# Patient Record
Sex: Male | Born: 1955 | Race: White | Hispanic: No | Marital: Single | Smoking: Current every day smoker
Health system: Southern US, Community
[De-identification: ages and names within clinical notes are randomized; demographics above are authoritative.]

## PROBLEM LIST (undated history)

## (undated) DIAGNOSIS — E669 Obesity, unspecified: Secondary | ICD-10-CM

## (undated) DIAGNOSIS — G4733 Obstructive sleep apnea (adult) (pediatric): Secondary | ICD-10-CM

## (undated) DIAGNOSIS — Z9989 Dependence on other enabling machines and devices: Secondary | ICD-10-CM

## (undated) DIAGNOSIS — Z720 Tobacco use: Secondary | ICD-10-CM

## (undated) DIAGNOSIS — E785 Hyperlipidemia, unspecified: Secondary | ICD-10-CM

## (undated) HISTORY — DX: Tobacco use: Z72.0

## (undated) HISTORY — DX: Dependence on other enabling machines and devices: Z99.89

## (undated) HISTORY — DX: Obesity, unspecified: E66.9

## (undated) HISTORY — DX: Hyperlipidemia, unspecified: E78.5

## (undated) HISTORY — DX: Obstructive sleep apnea (adult) (pediatric): G47.33

## (undated) HISTORY — PX: CHOLECYSTECTOMY: SHX55

---

## 2001-01-14 ENCOUNTER — Encounter: Payer: Self-pay | Admitting: Emergency Medicine

## 2001-01-14 ENCOUNTER — Inpatient Hospital Stay (HOSPITAL_COMMUNITY): Admission: EM | Admit: 2001-01-14 | Discharge: 2001-01-16 | Payer: Self-pay | Admitting: Emergency Medicine

## 2001-01-15 ENCOUNTER — Encounter: Payer: Self-pay | Admitting: *Deleted

## 2004-09-22 ENCOUNTER — Emergency Department (HOSPITAL_COMMUNITY): Admission: EM | Admit: 2004-09-22 | Discharge: 2004-09-22 | Payer: Self-pay | Admitting: Emergency Medicine

## 2010-05-25 ENCOUNTER — Observation Stay (HOSPITAL_COMMUNITY): Admission: EM | Admit: 2010-05-25 | Discharge: 2010-05-26 | Payer: Self-pay | Source: Home / Self Care

## 2010-05-25 LAB — CBC
HCT: 41.9 % (ref 39.0–52.0)
Hemoglobin: 15.3 g/dL (ref 13.0–17.0)
MCH: 31 pg (ref 26.0–34.0)
MCHC: 36.5 g/dL — ABNORMAL HIGH (ref 30.0–36.0)
MCV: 84.8 fL (ref 78.0–100.0)
Platelets: 184 10*3/uL (ref 150–400)
RBC: 4.94 MIL/uL (ref 4.22–5.81)
RDW: 13.5 % (ref 11.5–15.5)
WBC: 6.9 10*3/uL (ref 4.0–10.5)

## 2010-05-25 LAB — DIFFERENTIAL
Basophils Absolute: 0.1 10*3/uL (ref 0.0–0.1)
Basophils Relative: 1 % (ref 0–1)
Eosinophils Absolute: 0.2 10*3/uL (ref 0.0–0.7)
Eosinophils Relative: 3 % (ref 0–5)
Lymphocytes Relative: 26 % (ref 12–46)
Lymphs Abs: 1.8 10*3/uL (ref 0.7–4.0)
Monocytes Absolute: 1 10*3/uL (ref 0.1–1.0)
Monocytes Relative: 14 % — ABNORMAL HIGH (ref 3–12)
Neutro Abs: 3.9 10*3/uL (ref 1.7–7.7)
Neutrophils Relative %: 57 % (ref 43–77)

## 2010-05-25 LAB — POCT CARDIAC MARKERS
CKMB, poc: 1 ng/mL (ref 1.0–8.0)
CKMB, poc: 1.4 ng/mL (ref 1.0–8.0)
Myoglobin, poc: 100 ng/mL (ref 12–200)
Myoglobin, poc: 98.1 ng/mL (ref 12–200)
Troponin i, poc: <0.05 ng/mL (ref 0.00–0.09)
Troponin i, poc: <0.05 ng/mL (ref 0.00–0.09)

## 2010-05-25 LAB — BASIC METABOLIC PANEL
BUN: 11 mg/dL (ref 6–23)
CO2: 23 mEq/L (ref 19–32)
Calcium: 8.9 mg/dL (ref 8.4–10.5)
Chloride: 108 mEq/L (ref 96–112)
Creatinine, Ser: 1.13 mg/dL (ref 0.4–1.5)
GFR calc Af Amer: >60 mL/min (ref >60)
GFR calc non Af Amer: >60 mL/min (ref >60)
Glucose, Bld: 108 mg/dL — ABNORMAL HIGH (ref 70–99)
Potassium: 3.8 mEq/L (ref 3.5–5.1)
Sodium: 138 mEq/L (ref 135–145)

## 2010-05-25 LAB — CARDIAC PANEL(CRET KIN+CKTOT+MB+TROPI)
CK, MB: 2.8 ng/mL (ref 0.3–4.0)
Relative Index: 2.4 (ref 0.0–2.5)
Total CK: 115 U/L (ref 7–232)
Troponin I: 0.01 ng/mL (ref 0.00–0.06)

## 2010-05-26 ENCOUNTER — Encounter (INDEPENDENT_AMBULATORY_CARE_PROVIDER_SITE_OTHER): Payer: Self-pay | Admitting: *Deleted

## 2010-05-26 LAB — CONVERTED CEMR LAB
BUN: 11 mg/dL
CO2: 23 meq/L
Calcium: 8.9 mg/dL
Chloride: 108 meq/L
Cholesterol: 85 mg/dL
Creatinine, Ser: 1.13 mg/dL
Glucose, Bld: 108 mg/dL
HCT: 41.9 %
HDL: 15 mg/dL
Hemoglobin: 15.3 g/dL
Hgb A1c MFr Bld: 6 %
Hgb A1c MFr Bld: 6 %
LDL Cholesterol: 48 mg/dL
MCV: 85 fL
Platelets: 184 10*3/uL
Potassium: 3.8 meq/L
Sodium: 138 meq/L
Triglyceride fasting, serum: 112 mg/dL
WBC: 6.9 10*3/uL

## 2010-05-26 LAB — CARDIAC PANEL(CRET KIN+CKTOT+MB+TROPI)
CK, MB: 1.7 ng/mL (ref 0.3–4.0)
CK, MB: 2.1 ng/mL (ref 0.3–4.0)
Relative Index: INVALID (ref 0.0–2.5)
Relative Index: INVALID (ref 0.0–2.5)
Total CK: 85 U/L (ref 7–232)
Total CK: 84 U/L (ref 7–232)
Troponin I: <0.01 ng/mL (ref 0.00–0.06)
Troponin I: <0.01 ng/mL (ref 0.00–0.06)

## 2010-05-26 LAB — LIPID PANEL
Cholesterol: 85 mg/dL (ref 0–200)
HDL: 15 mg/dL — ABNORMAL LOW (ref >39)
LDL Cholesterol: 48 mg/dL (ref 0–99)
Total CHOL/HDL Ratio: 5.7 RATIO
Triglycerides: 112 mg/dL (ref <150)
VLDL: 22 mg/dL (ref 0–40)

## 2010-05-26 LAB — HEMOGLOBIN A1C
Hgb A1c MFr Bld: 6 % — ABNORMAL HIGH (ref <5.7)
Mean Plasma Glucose: 126 mg/dL — ABNORMAL HIGH (ref <117)

## 2010-05-31 ENCOUNTER — Ambulatory Visit
Admission: RE | Admit: 2010-05-31 | Discharge: 2010-05-31 | Payer: Self-pay | Source: Home / Self Care | Attending: Cardiology | Admitting: Cardiology

## 2010-05-31 DIAGNOSIS — E669 Obesity, unspecified: Secondary | ICD-10-CM | POA: Insufficient documentation

## 2010-05-31 DIAGNOSIS — F17200 Nicotine dependence, unspecified, uncomplicated: Secondary | ICD-10-CM | POA: Insufficient documentation

## 2010-05-31 DIAGNOSIS — R079 Chest pain, unspecified: Secondary | ICD-10-CM | POA: Insufficient documentation

## 2010-06-01 ENCOUNTER — Encounter: Payer: Self-pay | Admitting: Cardiology

## 2010-06-14 ENCOUNTER — Ambulatory Visit (HOSPITAL_COMMUNITY)
Admission: RE | Admit: 2010-06-14 | Discharge: 2010-06-14 | Payer: Self-pay | Source: Home / Self Care | Attending: Cardiology | Admitting: Cardiology

## 2010-06-14 ENCOUNTER — Ambulatory Visit
Admission: RE | Admit: 2010-06-14 | Discharge: 2010-06-14 | Payer: Self-pay | Source: Home / Self Care | Attending: Cardiology | Admitting: Cardiology

## 2010-06-17 NOTE — Discharge Summary (Addendum)
Bobby Ramos              ACCOUNT NO.:  [id number]  MEDICAL RECORD NO.:  [id number]          PATIENT TYPE:  OBV  LOCATION:  A203                          FACILITY:  [facility]  PHYSICIAN:  Ramiro Harvest, MD    DATE OF BIRTH:  1955/09/05  DATE OF ADMISSION:  05/25/2010 DATE OF DISCHARGE:  01/05/2012LH                         DISCHARGE SUMMARY-REFERRING   The patient's primary care physician is with [facility].  DISCHARGE DIAGNOSES: 1. Chest pain, resolved. 2. Obstructive sleep apnea. 3. Tobacco abuse. 4. Obesity. 5. Status post cholecystectomy.  DISCHARGE MEDICATIONS: 1. Aspirin 325 mg p.o. daily. 2. Nitroglycerin 0.4 mg sublingual q.5 minutes x3 p.r.n. chest pain. 3. Ibuprofen 800 mg p.o. q.4 h p.r.n. 4. Multivitamin 1 tablet p.o. daily.  DISPOSITION AND FOLLOWUP:  The patient will be discharged home.  The patient is to follow up with PCP in the next 1 to 2 weeks.  On followup, the patient's fasting lipid panel will need to be reassessed as well as his chest pain.  The patient is also scheduled to follow up with [facility] in [place] on May 31, 2010, at 1:15 p.m. for reassessment and for probable outpatient stress test.  CONSULTATIONS DONE:  None.  PROCEDURES PERFORMED:  A chest x-ray was done May 25, 2010, that showed borderline cardiomegaly, no CHF or active disease in 1 view.  BRIEF ADMISSION HISTORY AND PHYSICAL:  Bobby Ramos is a pleasant 55 year old gentleman with a history of tobacco abuse.  The patient has no known diabetes.  No history of hypertension.  Unknown lipid status. No significant family history of early coronary artery disease.  The patient did state that he was at his job on the morning of admission standing without particularly exerting himself, at which time he developed acute onset of possibly questionable type substernal chest sensation.  The patient states that it was not exactly pain.  It was  not stabbing or sharp.  It was simply a feeling that he not had before, and it felt dull in nature.  This was accompanied by nausea as well as diaphoresis.  It was quite troubling to the patient, and again he was standing at the time it came on but was not exerting himself.  He went home and changed clothes.  His symptoms persisted.  As a result, he presented to the [facility] for evaluation.  EKG there which was done was nonacute.  The patient was given aspirin, nitroglycerin, and this was approximately 1 hour total from the onset of his symptoms.  His symptoms then abruptly resolved.  He was then referred to [facility] emergency department for evaluation.  At the time of evaluation per admitting physician, the patient was resting comfortably in the emergency room at [facility].  He had no complaints whatsoever at the present time.  He was having no chest discomfort.  No shortness of breath.  No nausea, no vomiting, no diaphoresis.  The patient denied any prior similar episodes.  For the rest of the admission history and physical, please see the history and physical dictated per Dr. Sharon Seller of job number [id number].  HOSPITAL COURSE:  Chest pain.  The patient was admitted for a chest pain rule out.  He was placed on tele, and he was admitted secondary to a sensation of some pressure and his pain as well as nausea and diaphoresis.  The patient does have a history of tobacco abuse and did have a cardiac catheterization in August 2002 that showed mild hemodynamically insignificant coronary artery disease at that time.  EKG which was done showed a normal sinus rhythm.  Cardiac enzymes were cycled q.8 h x3, which were negative x3.  The patient was placed on a full-dose aspirin and placed on nitroglycerin on an as-needed basis. The patient was initially started on low-dose beta blocker, which he was able to tolerate, however, had a little bit of a sinus bradycardia  with heart rate in the 55s and a systolic blood pressure of 109.  His low- dose beta blocker was discontinued.  The patient did not have any further chest pain during the hospitalization.  A fasting lipid panel was pending at the time of discharge.  The patient will be discharged home in stable and improved condition to follow up with [facility] for probable outpatient stress test.  The patient will be discharged in stable and improved condition, and the patient was counseled on tobacco cessation, and a tobacco cessation consultation was requested during the hospitalization.  The patient will be discharged home in stable and improved condition.  The rest of the patient's chronic medical issues remained stable throughout the hospitalization. The patient was discharged in stable condition.  On the day of discharge, vital signs were temperature 97.6, pulse of 64, respirations 21, blood pressure 109/69, satting 94% on room air.  It was a pleasure taking care of [name].     Ramiro Harvest, MD     DT/MEDQ  D:  05/26/2010  T:  05/26/2010  Job:  [id number]  cc:   [facility]  Electronically Signed by Ramiro Harvest MD on 06/17/2010 12:14:15 PM

## 2010-06-20 ENCOUNTER — Encounter (INDEPENDENT_AMBULATORY_CARE_PROVIDER_SITE_OTHER): Payer: Self-pay | Admitting: *Deleted

## 2010-06-20 ENCOUNTER — Ambulatory Visit
Admission: RE | Admit: 2010-06-20 | Discharge: 2010-06-20 | Payer: Self-pay | Source: Home / Self Care | Attending: Cardiology | Admitting: Cardiology

## 2010-06-23 NOTE — Assessment & Plan Note (Signed)
Summary: new post hosp [name] per [name]/tg   Visit Type:  Follow-up Primary Provider:  [facility]   History of Present Illness: [name] is seen in the office today following a recent admission to [facility] for chest discomfort.  He presented to [facility] with moderately severe nondescript lower substernal chest discomfort without radiation or associated symptoms.  The onset of discomfort occurred when he was standing but not exerting himself.  Symptoms were relieved with sublingual nitroglycerin at his primary care physician's office.  Myocardial infarction was ruled out, and he was referred to [facility] cardiology for further evaluation.    Mr. [name] was hospitalized for chest discomfort in 2002 at which time cardiac catheterization revealed no valvular problems, normal left ventricular systolic function and insignificant coronary disease with the worst lesion being a 30% LAD stenosis.  Cardiovascular risk factors are modest with no history of hypertension, diabetes or premature coronary disease in the family.  He has been a cigarette smoker and has low HDL as well as low total and LDL cholesterol.  Current Medications (verified): 1)  Aspirin 325 Mg Tabs (Aspirin) .... Take 1 Tab Daily 2)  Nitrostat 0.4 Mg Subl (Nitroglycerin) .Marland Kitchen.. 1 Tablet Under Tongue At Onset of Chest Pain; You May Repeat Every 5 Minutes For Up To 3 Doses. 3)  Ibuprofen 800 Mg Tabs (Ibuprofen) .... Take Prn 4)  Daily-Vitamin  Tabs (Multiple Vitamin) .... Take 1 Tab Daily  Allergies (verified): No Known Drug Allergies  Comments:  Nurse/Medical Assistant: patient reviewed meds [facility] [place]  Past History:  Family History: Last updated: 06/06/2010 Father: Deceased as a result of Alzheimer's disease; h/o CAD Mother: s/p pacemaker implantation Siblings: 2 brothers and 2 sisters are alive and well  Social  History: Last updated: 06-06-10 Employment-[employer] of a [employer] Tobacco Use - 70 pack years; 1.5-2 packs per day Alcohol Use - modest Regular Exercise - no Drug Use - no Marital-single with no children  Past Medical History: Chest pain: 2002-insignificant CAD-30% LAD and RCA with normal EF; admit [facility] 05/2010 Dyslipidemia: Low HDL Obstructive sleep apnea treated with CPAP Tobacco abuse: 70 pack years; 1.5-2 packs per day Obesity  Past Surgical History: Cholecystectomy  EKG  Procedure date:  Jun 06, 2010  Findings:      Normal sinus rhythm Borderline left atrial abnormality Minor nonspecific T wave abnormality Nondiagnostic inferior Q waves No previous tracing for comparison.  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  Vital Signs:  Patient profile:   55 year old male Height:      70 inches Weight:      268 pounds BMI:     38.59 O2 Sat:      97 % on Room air Pulse rate:   77 / minute BP sitting:   128 / 73  (left arm)  Vitals Entered By: Dreama Saa, CNA (May 31, 2010 1:14 PM)  O2 Flow:  Room air  Physical Exam  General:  Obese; well-developed; no acute distress: HEENT-[place]/AT; PERRL; EOM  intact; conjunctiva and lids nl:  Neck-No JVD; no carotid bruits: Endocrine-No thyromegaly: Lungs-No tachypnea, clear without rales, rhonchi or wheezes: CV-normal PMI; normal S1 and S2: grade 2/6 basilar systolic ejection murmur Abdomen-BS normal; soft and non-tender without masses or organomegaly: MS-No deformities, cyanosis or clubbing: Neurologic-Nl cranial nerves; symmetric strength and tone: Skin- Warm, no sig. lesions: Extremities-Nl distal pulses; no edema    Impression & Recommendations:  Problem # 1:  CHEST PAIN (ICD-786.50) Symptoms were far from classic, but certainly could represent myocardial ischemia.  Since resting EKG is normal, we will proceed with a standard treadmill exercise test.  Problem # 2:  SYSTOLIC MURMUR (ZOX-096.0) Patient's murmur has the characteristics of aortic sclerosis or mild aortic stenosis.  An echocardiogram will be performed to differentiate between these possibilities.  Problem # 3:  TOBACCO ABUSE (ICD-305.1) Patient is strongly encouraged to consider discontinuation of tobacco use.  He attempted this with the assistance of Chantix in the past, but found that drug to be too expensive.  We will put him in touch with the [place] [facility] where free transdermal nicotine patches are currently available.  Other Orders: Treadmill (Treadmill) 2-D Echocardiogram (2D Echo)  Patient Instructions: 1)  Your physician recommends that you schedule a follow-up appointment in: AFTER TESTS 2)  Your physician has requested that you have an echocardiogram.  Echocardiography is a painless test that uses sound waves to create images of your heart. It provides your doctor with information about the size and shape of your heart and how well your heart's chambers and valves are working.  This procedure takes approximately one hour. There are no restrictions for this procedure. 3)  Your physician has requested that you have an exercise tolerance test.  For further information  please visit [email or web address]  Please also follow instruction sheet, as given.

## 2010-06-29 NOTE — Miscellaneous (Signed)
Summary: LABS A1C 05/26/2010  Clinical Lists Changes  Observations: Added new observation of HGBA1C: 6.0 % (05/26/2010 8:38)

## 2010-06-29 NOTE — Assessment & Plan Note (Signed)
Summary: ROV POST TREADMILL AND ECHO   Visit Type:  Follow-up Primary Provider:  [facility]   History of Present Illness: [name] returns to the office as scheduled for continued assessment and treatment of recent chest discomfort that prompted hospitalization.  Over the past few weeks, all symptoms have resolved.  He has had no further chest discomfort.  He also developed left knee pain that was present on the day of the stress test, but which has also resolved.  Echocardiography showed mild aortic stenosis, with normal left ventricular size and function and no aortic insufficiency.  A stress nuclear study was negative for ischemia.  Current Medications (verified): 1)  Aspirin 325 Mg Tabs (Aspirin) .... Take 1 Tab Daily 2)  Nitrostat 0.4 Mg Subl (Nitroglycerin) .Marland Kitchen.. 1 Tablet Under Tongue At Onset of Chest Pain; You May Repeat Every 5 Minutes For Up To 3 Doses. 3)  Daily-Vitamin  Tabs (Multiple Vitamin) .... Take 1 Tab Daily 4)  Fish Oil 1000 Mg Caps (Omega-3 Fatty Acids) .... Take 1 Cap Two Times A Day 5)  Pravastatin Sodium 40 Mg Tabs (Pravastatin Sodium) .... Take One Tablet By Mouth Daily At Bedtime  Allergies (verified): No Known Drug Allergies  Comments:  Nurse/Medical Assistant: patient didnt bring meds or list he is done with his ibuprofen pharmacy is [facility] in [place] we reviewed meds from previous ov  Past History:  PMH, FH, and Social History reviewed and updated.  Review of Systems  The patient denies weight loss, weight gain, chest pain, syncope, dyspnea on exertion, peripheral edema, prolonged cough, and abdominal pain.    Vital Signs:  Patient profile:   55 year old male Weight:      278 pounds O2 Sat:      96 % on Room air Pulse rate:   91 / minute BP sitting:   129 / 76  (left arm)  Vitals Entered By: Dreama Saa, CNA (June 20, 2010 2:49 PM)  O2 Flow:  Room air  Physical Exam  General:  Obese;  well-developed; no acute distress: Neck-No JVD; no carotid bruits: Lungs-No tachypnea, clear without rales, rhonchi or wheezes: CV-normal PMI; normal S1 and S2: grade 2/6 basilar systolic ejection murmur Abdomen-BS normal; soft and non-tender without masses or organomegaly: MS-No deformities, cyanosis or clubbing: Neurologic-Nl cranial nerves; symmetric strength and tone: Skin- Warm, no sig. lesions: Extremities-Nl distal pulses; no edema    Impression & Recommendations:  Problem # 1:  AORTIC STENOSIS-MILD (ICD-424.1) Valvular disease is quite mild at present and is certainly not causing any symptoms.  Annual followup for this problem is warranted.  Problem # 2:  CHEST PAIN (ICD-786.50) Chest discomfort has resolved and was likely not of cardiac origin.  No further testing or treatment is warranted unless symptoms return.  Problem # 3:  TOBACCO ABUSE (ICD-305.1) We continue to recommend smoking cessation and will assist [name] with this undertaking in any way that we can.  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  Treatment with a statin likely offers him some benefit in terms of a lower risk for a cardiovascular event and will be initiated.  Patient Instructions: 1)  Your physician recommends that you schedule a follow-up appointment in: 1 year 2)  Your physician has recommended you make the following change in your medication: pravastatin 40mg  daily Prescriptions: PRAVASTATIN SODIUM 40 MG TABS (PRAVASTATIN SODIUM) Take  one tablet by mouth daily at bedtime  #30 x 3   Entered by:   Teressa Lower RN   Authorized by:   Kathlen Brunswick, MD, [facility]   Signed by:   Teressa Lower RN on 06/20/2010   Method used:   Electronically to        [facility] (retail)       [address]       [place], [place]  [address]       Ph:  [phone]       Fax: [phone]   RxID:   [id number]

## 2010-10-07 NOTE — Discharge Summary (Signed)
[place]. [facility]  Patient:    VERONICA, GUERRANT Visit Number: [id number] MRN: [id number]          Service Type: MED Location: [facility] 2899 06 Attending Physician:  Glennon Hamilton Dictated by:   Tereso Newcomer, P.A. Admit Date:  01/15/2001 Discharge Date: 01/16/2001   CC:         Stacie Glaze, M.D. [facility]  [facility], [place]   Discharge Summary  DATE OF BIRTH: Feb 03, 1956  DISCHARGE DIAGNOSES: 1. Chest pain, etiology unclear. 2. Insignificant coronary artery disease. 3. Normal left ventricular ejection fraction of 68%. 4. Positive family history of coronary artery disease. 5. Ex-smoker.  PROCEDURES PERFORMED THIS ADMISSION: Cardiac catheterization by Dr. Daisey Must on January 16, 2001, revealing left main normal LAD, mid 30% stenosis circumflex with minor irregularities, RCA proximal 30% stenosis, distal 20/30% stenosis. LV-gram: Normal wall motion, EF 68%.  HOSPITAL COURSE: This 55 year old male was initially seen at [facility] in [place] for complaints of chest pain. He had no known cardiac history prior to admission at [facility]. He developed severe chest tightness radiating around to his left side associated with mild nausea while he was showering. He had no associated neck or arm pain, shortness of breath, diaphoresis, or palpitations. His activities include a lot of hiking and lifting. He denies symptoms with this. Initial enzymes at [facility] included CK-MB and troponin and these were negative x2. His ECG was also within normal limits. His cardiac risk factors included a positive family history for CAD. He is an ex-smoker who quit 2 years ago, but he denied any diabetes or hypertension.  His initial physical examination was unremarkable. His blood pressure was 109/70.  His ECG showed normal sinus rhythm. The options of how to work-up his chest pain were discussed with the patient. These  included gated exercise treadmill Cardiolite versus cardiac catheterization. Given the patients family history of CAD he opted for definitive answers and requested cardiac catheterization. Therefore he was transferred to [facility]. He underwent cardiac catheterization on January 16, 2001, by Dr. Loraine Leriche Dr. Gerri Spore.  The results are noted above. He tolerated the procedure well and had no immediate complications. His right femoral artery was closed using Perclose. Given his insignificant coronary artery disease by catheterization it was felt his chest pain was noncardiac. He would need continued followup with his primary care physician. It was recommended he continue to take an aspirin a day. He returned to the short-stay area to undergo postcatheterization rest and hydration. Once this period of time was complete, he was felt ready for discharge to home and was discharged to home in stable condition.  LABORATORY AND OTHER ANCILLARY DATA: Sodium 140, potassium 3.7, chloride 105, CO2 28, glucose 94, BUN 16, creatinine 1.1, calcium 9.8, total protein 7.2, albumin 3.8. AST 25, ALT 30, alkaline phosphatase 89, total bilirubin 0.7. INR 1.0. White blood cell count 7800. Hemoglobin 15.8, hematocrit 45.2, MCV 88.9. Platelet count 202,000. Cardiac enzymes negative x2.  Chest x-ray on January 15, 2001, revealing peribronchial cuffing attributed to bronchial inflammatory process, acute versus chronic. There were no focal infiltrates noted and the heart size was normal.  DISCHARGE MEDICATION: Aspirin 325 mg q.d.  ACTIVITY: No diving, heavy lifting, exertion of work for 3 days.  DIET: Low fat, low salt.  SPECIAL INSTRUCTIONS: He has been asked to call our office in [place] for any groin, swelling, bleeding or bruising.  FOLLOWUP: He should followup with Dr. Lovell Sheehan 1  week from discharge and he should call for an appointment and he should followup with Dr. Dietrich Pates as needed. Dictated  by:   Tereso Newcomer, P.A. Attending Physician:  Glennon Hamilton DD:  03/12/01 TD:  03/13/01 Job: [id number]

## 2010-10-07 NOTE — Discharge Summary (Signed)
[place]. [facility]  Patient:    Bobby Ramos, Bobby Ramos Visit Number: [id number] MRN: [id number]          Service Type: MED Location: [facility] 2899 06 Attending Physician:  Glennon Hamilton Dictated by:   Tereso Newcomer, P.A. Adm. Date:  01/15/2001 Disc. Date: 01/16/01   CC:         Stacie Glaze, M.D. [facility]  [facility]. Dietrich Pates, M.D. [facility] offices)   Discharge Summary  DATE OF BIRTH:  [birth year]  DISCHARGE DIAGNOSES: 1. Chest pain, etiology unclear. 2. Insignificant coronary artery disease. 3. Family history of coronary artery disease. 4. Tobacco abuse. 5. Dyslipidemia (low HDL at 21 mg/dl).  PROCEDURES:  Cardiac catheterization by Dr. Daisey Must on January 16, 2001, reveals normal left main, LAD with mid 30% stenosis, left circumflex with minor irregularities, RCA with proximal 30% stenosis, distal 30%/20% stenosis, normal left ventricular EF calculated at 68%, normal wall motion of left ventricle.  HOSPITAL COURSE:  This patient was originally seen at [facility] on January 15, 2001, in consultation for chest pain.  He developed chest pain while showering on the day prior to admission.  He described it as chest tightness radiating around to his left side associated with mild nausea.  He took aspirin.  The pain eased spontaneously in about 30 minutes upon arrival to the emergency room.  He had no neck or arm pain, shortness of breath, diaphoresis, or palpitations.  He is very active without exertional chest pain.  His risk factors include a positive family history, positive tobacco abuse but no diabetes or hypertension.  PHYSICAL EXAMINATION:  VITAL SIGNS:  Blood pressure 109/70, pulse 70, telemetry normal sinus rhythm.  NECK:  Without JVD or bruit.  LUNGS:  Clear.  HEART:  Regular rate and rhythm.  Normal S1 and S2.  A 2/6 systolic murmur at left sternal border and apex.  ABDOMEN:  Soft,  nontender.  EXTREMITIES:  Without edema.  LABORATORY DATA: EKG: Normal sinus rhythm.  CK, MB, and troponin I negative x 2.  The patient was seen by Dr. Dietrich Pates who discussed possible evaluation of the chest pain including catheterization and exercise treadmill test.  The patient opted for cardiac catheterization for definite answer regarding his chest pain.  Therefore, the patient was transferred to [facility]  He underwent cardiac catheterization on January 16, 2001.  The results are noted above.  He tolerated the procedure well without immediate complications.  Post catheterization, his groin remained stable without hematoma or bruit, and it was felt he was ready for discharge to home.  Other causes of chest pain should be sought.  He can follow up with his primary care physician, Dr. Lovell Sheehan, to investigate this.  His lipid profile returned with a total cholesterol of 158, triglycerides 161, HDL 21, and LDL 105.  Consideration can be given for niacin therapy at followup with his primary care physician.  He can follow up with cardiology as needed.  LABORATORY DATA:  Cardiac enzymes negative x 3.  Lipid profile as noted above. White blood cell count 7800, hemoglobin 15.8, hematocrit 45.2, platelet count 210,000.  INR 1.0.  Sodium 140, potassium 3.7, chloride 105, CO2 28, glucose 94, BUN 16, creatinine 1.1, total bilirubin 0.7, alkaline phosphatase 89, AST 25, ALT 30, total protein 7.2, albumin 3.8, calcium 9.8.  Chest x-ray revealed peribronchial cuffing attributed to bronchial inflammatory process acute versus chronic, negative for focal infiltrates. Heart size normal.  DISCHARGE  MEDICATIONS:  Coated aspirin 325 mg q.d.  ACTIVITY:  No driving, heavy lifting, or exertional work for three days.  DIET:  Low fat, low sodium.  DISCHARGE INSTRUCTIONS:  He is to call our office in [place] for any swelling, bleeding, or bruising.  He should see Dr. Lovell Sheehan next week, and  he should call him for an appointment.  He should follow up with Dr. Dietrich Pates as needed.  He has been advised to stop smoking.  As noted above, consideration can be made for niacin therapy at followup appointment with his primary care physician. Dictated by:   Tereso Newcomer, P.A. Attending Physician:  Glennon Hamilton DD:  01/16/01 TD:  01/16/01 Job: [id number]

## 2010-10-07 NOTE — Cardiovascular Report (Signed)
[place]  Patient:    Bobby Ramos, Bobby Ramos Visit Number: [id number] MRN: [id number]          Service Type: MED Location: [facility] 2899 [id number] Attending Physician:  Glennon Hamilton Dictated by:   Daisey Must, M.D. [facility] Proc. Date: 01/16/01 Adm. Date:  01/15/2001   CC:         Stacie Glaze, M.D. [facility]  Gerrit Friends. Rothbart, M.D. [facility]  Cardiac catheter lab   Cardiac Catheterization  PROCEDURE:  Left heart catheterization with coronary angiography and left ventriculography.  INDICATION:  Mr. Brumbaugh is a 55 year old male admitted with chest pain.  He was referred for cardiac catheterization to rule out coronary artery disease.  DESCRIPTION OF PROCEDURE:  A 6 French sheath was placed in the right femoral artery.  A standard Judkins 6 French catheters were utilized.  Contrast was Omnipaque.  At the conclusion of the procedure, a perclose vascular closure device was placed in the right femoral artery with good hemostasis.  There were no complications.  RESULTS:  Hemodynamic data: 1. Left ventricular pressure 110/16. 2. Aortic pressure 112/70. 3. There was no aortic valve gradient.  Left ventriculogram:  Wall motion is normal.  Ejection fraction is calculated at 68%.  There was no mitral regurgitation.  Coronary angiography (right dominant): 1. Left main is normal. 2. Left anterior descending coronary artery has a 30% stenosis in the    mid-vessel.  The LAD gives rise to a normal size first diagonal and a    small second diagonal. 3. Left circumflex has minor luminal irregularities in the distal vessel.    It gives rise to a large OM1, small OM2, and a normal OM3. 4. Right coronary artery has a 30% stenosis in the proximal vessel, 30%    in the distal vessel of the acute margin, and a 20% stenosis just beyond    the posterior descending artery.  The distal right coronary artery gives    rise to a large posterior descending artery, a small first  posterolateral    branch, and a normal second posterolateral branch.  IMPRESSION: 1. Normal left ventricular systolic function. 2. Mild hemodynamically insignificant coronary artery disease.  DISPOSITION:  The patients chest pain appears to be noncardiac.  He will be managed medically. Dictated by:   Daisey Must, M.D. [facility] Attending Physician:  Glennon Hamilton DD:  01/16/01 TD:  01/16/01 Job: [id number]

## 2011-05-17 ENCOUNTER — Encounter: Payer: Self-pay | Admitting: Cardiology

## 2011-11-26 IMAGING — CR DG CHEST 1V PORT
1 series · 1 of 1 positions shown · non-contrast
Comparison: None.

CLINICAL DATA: Chest pain

PORTABLE CHEST - 1 VIEW

[view not recorded]
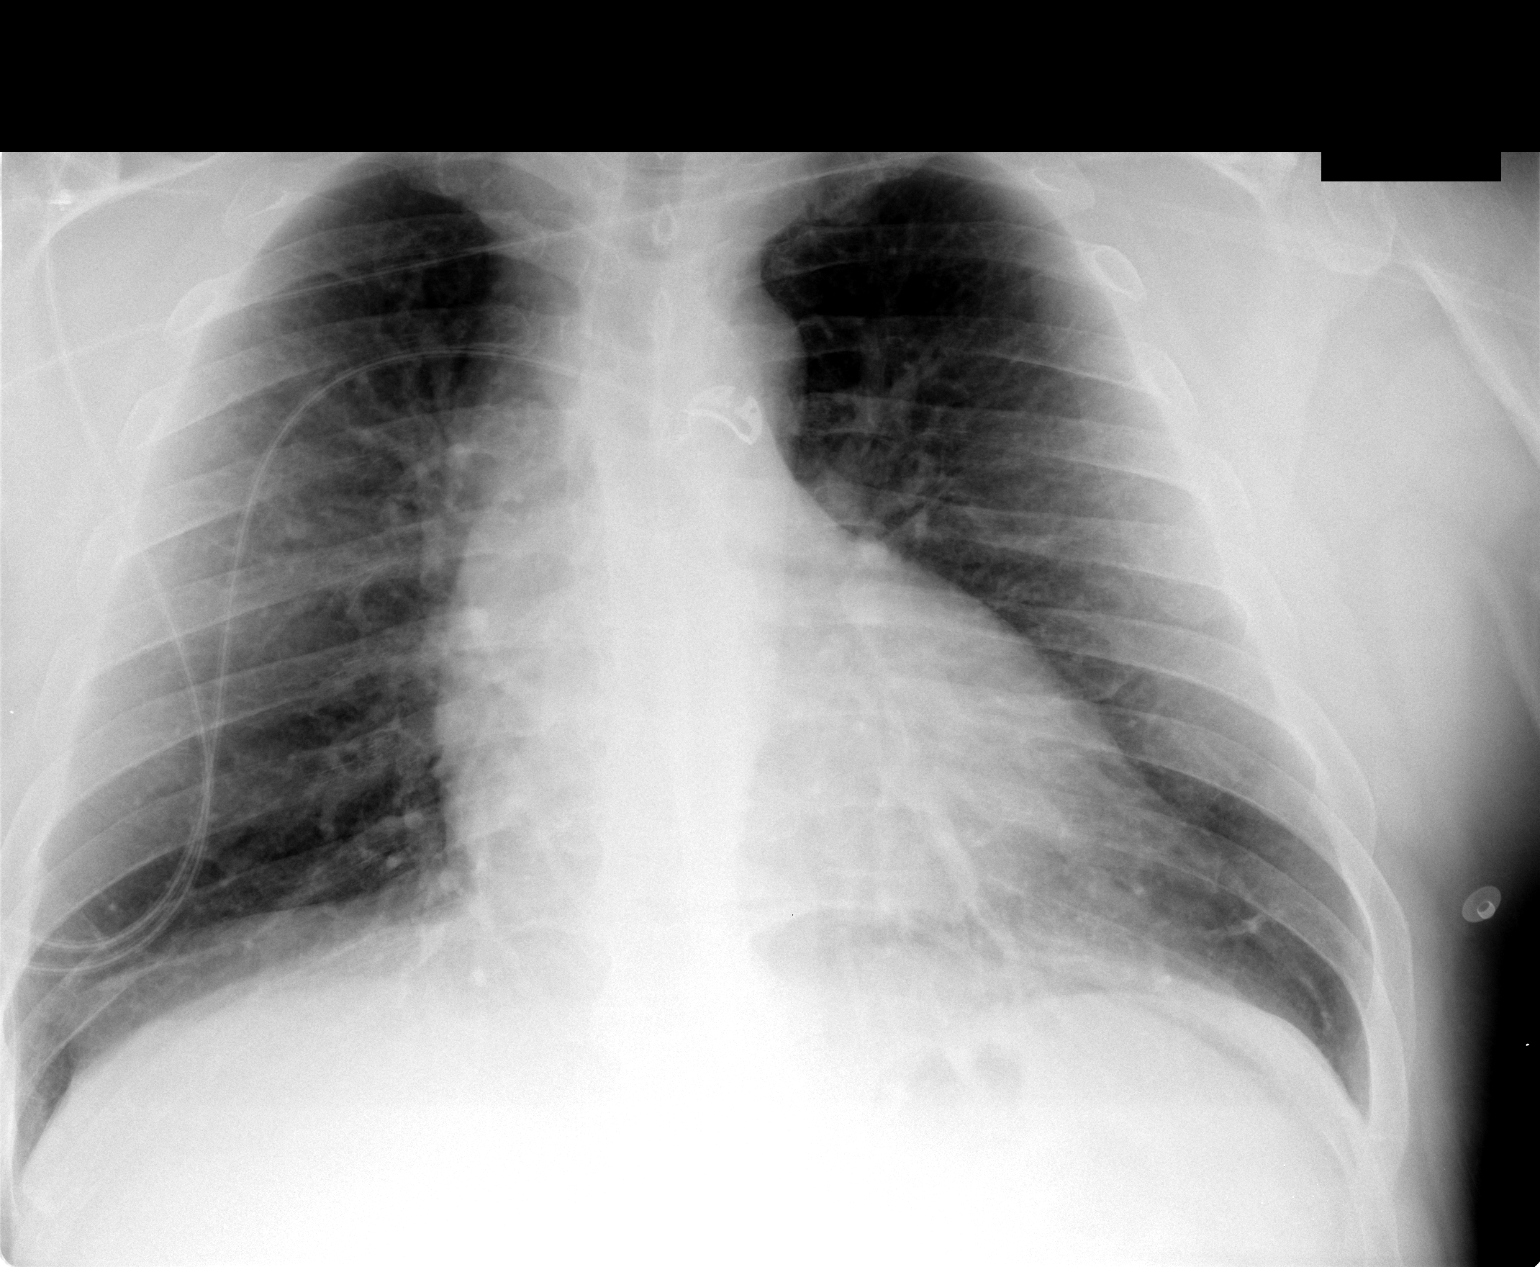

[1 of 1 positions shown; findings below may reference images not displayed]

FINDINGS: Heart borderline enlarged considering portable technique.
No congestive heart failure or active disease in one-view.  Osseous
structures intact.
IMPRESSION: Borderline cardiomegaly - no congestive heart failure or active
disease in one-view.

## 2014-12-02 ENCOUNTER — Encounter (HOSPITAL_COMMUNITY): Payer: Self-pay | Admitting: Emergency Medicine

## 2014-12-02 ENCOUNTER — Emergency Department (HOSPITAL_COMMUNITY)
Admission: EM | Admit: 2014-12-02 | Discharge: 2014-12-02 | Disposition: A | Discharge status: Home / Self Care | Payer: 59 | Attending: Emergency Medicine | Admitting: Emergency Medicine

## 2014-12-02 ENCOUNTER — Emergency Department (HOSPITAL_COMMUNITY): Payer: 59

## 2014-12-02 DIAGNOSIS — M79604 Pain in right leg: Secondary | ICD-10-CM | POA: Diagnosis present

## 2014-12-02 DIAGNOSIS — Z79899 Other long term (current) drug therapy: Secondary | ICD-10-CM | POA: Diagnosis not present

## 2014-12-02 DIAGNOSIS — G4733 Obstructive sleep apnea (adult) (pediatric): Secondary | ICD-10-CM | POA: Diagnosis not present

## 2014-12-02 DIAGNOSIS — Z7952 Long term (current) use of systemic steroids: Secondary | ICD-10-CM | POA: Diagnosis not present

## 2014-12-02 DIAGNOSIS — Z7982 Long term (current) use of aspirin: Secondary | ICD-10-CM | POA: Insufficient documentation

## 2014-12-02 DIAGNOSIS — E669 Obesity, unspecified: Secondary | ICD-10-CM | POA: Insufficient documentation

## 2014-12-02 DIAGNOSIS — E785 Hyperlipidemia, unspecified: Secondary | ICD-10-CM | POA: Insufficient documentation

## 2014-12-02 DIAGNOSIS — Z9981 Dependence on supplemental oxygen: Secondary | ICD-10-CM | POA: Diagnosis not present

## 2014-12-02 DIAGNOSIS — M5431 Sciatica, right side: Secondary | ICD-10-CM | POA: Diagnosis not present

## 2014-12-02 DIAGNOSIS — Z720 Tobacco use: Secondary | ICD-10-CM | POA: Diagnosis not present

## 2014-12-02 MED ORDER — GABAPENTIN 300 MG PO CAPS
ORAL_CAPSULE | ORAL | Status: AC
  Filled 2014-12-02: qty 1

## 2014-12-02 MED ORDER — PREDNISONE 10 MG PO TABS
ORAL_TABLET | ORAL | Status: AC
  Filled 2014-12-02: qty 1

## 2014-12-02 MED ORDER — OXYCODONE-ACETAMINOPHEN 5-325 MG PO TABS
ORAL_TABLET | ORAL | Status: AC
  Filled 2014-12-02: qty 1

## 2014-12-02 MED ORDER — PREDNISONE 50 MG PO TABS
ORAL_TABLET | ORAL | Status: AC
  Filled 2014-12-02: qty 1

## 2014-12-02 MED ORDER — PREDNISONE 50 MG PO TABS
60.0000 mg | ORAL_TABLET | Freq: Once | ORAL | Status: AC
  Administered 2014-12-02: 60 mg via ORAL

## 2014-12-02 MED ORDER — GABAPENTIN 300 MG PO CAPS
300.0000 mg | ORAL_CAPSULE | Freq: Once | ORAL | Status: AC
  Administered 2014-12-02: 300 mg via ORAL

## 2014-12-02 MED ORDER — GABAPENTIN 300 MG PO CAPS: 300.0000 mg | ORAL_CAPSULE | Freq: Two times a day (BID) | ORAL | Status: AC

## 2014-12-02 MED ORDER — OXYCODONE-ACETAMINOPHEN 5-325 MG PO TABS
1.0000 | ORAL_TABLET | Freq: Once | ORAL | Status: AC
  Administered 2014-12-02: 1 via ORAL

## 2014-12-02 MED ORDER — PREDNISONE 20 MG PO TABS: 60.0000 mg | ORAL_TABLET | Freq: Every day | ORAL | Status: AC

## 2014-12-02 MED ORDER — OXYCODONE-ACETAMINOPHEN 5-325 MG PO TABS: 1.0000 | ORAL_TABLET | Freq: Four times a day (QID) | ORAL | Status: AC | PRN

## 2014-12-02 NOTE — Discharge Instructions (Signed)
You presented with back pain. Your symptoms are consistent with sciatica. Your plain films just showed degenerative changes. Follow-up with her primary physician if symptoms do not improve with supportive measures. He will be given 4 days of additional stones for anti-inflammatory effect and pain medication.  Sciatica Sciatica is pain, weakness, numbness, or tingling along the path of the sciatic nerve. The nerve starts in the lower back and runs down the back of each leg. The nerve controls the muscles in the lower leg and in the back of the knee, while also providing sensation to the back of the thigh, lower leg, and the sole of your foot. Sciatica is a symptom of another medical condition. For instance, nerve damage or certain conditions, such as a herniated disk or bone spur on the spine, pinch or put pressure on the sciatic nerve. This causes the pain, weakness, or other sensations normally associated with sciatica. Generally, sciatica only affects one side of the body. CAUSES   Herniated or slipped disc.  Degenerative disk disease.  A pain disorder involving the narrow muscle in the buttocks (piriformis syndrome).  Pelvic injury or fracture.  Pregnancy.  Tumor (rare). SYMPTOMS  Symptoms can vary from mild to very severe. The symptoms usually travel from the low back to the buttocks and down the back of the leg. Symptoms can include:  Mild tingling or dull aches in the lower back, leg, or hip.  Numbness in the back of the calf or sole of the foot.  Burning sensations in the lower back, leg, or hip.  Sharp pains in the lower back, leg, or hip.  Leg weakness.  Severe back pain inhibiting movement. These symptoms may get worse with coughing, sneezing, laughing, or prolonged sitting or standing. Also, being overweight may worsen symptoms. DIAGNOSIS  Your caregiver will perform a physical exam to look for common symptoms of sciatica. He or she may ask you to do certain movements or  activities that would trigger sciatic nerve pain. Other tests may be performed to find the cause of the sciatica. These may include:  Blood tests.  X-rays.  Imaging tests, such as an MRI or CT scan. TREATMENT  Treatment is directed at the cause of the sciatic pain. Sometimes, treatment is not necessary and the pain and discomfort goes away on its own. If treatment is needed, your caregiver may suggest:  Over-the-counter medicines to relieve pain.  Prescription medicines, such as anti-inflammatory medicine, muscle relaxants, or narcotics.  Applying heat or ice to the painful area.  Steroid injections to lessen pain, irritation, and inflammation around the nerve.  Reducing activity during periods of pain.  Exercising and stretching to strengthen your abdomen and improve flexibility of your spine. Your caregiver may suggest losing weight if the extra weight makes the back pain worse.  Physical therapy.  Surgery to eliminate what is pressing or pinching the nerve, such as a bone spur or part of a herniated disk. HOME CARE INSTRUCTIONS   Only take over-the-counter or prescription medicines for pain or discomfort as directed by your caregiver.  Apply ice to the affected area for 20 minutes, 3-4 times a day for the first 48-72 hours. Then try heat in the same way.  Exercise, stretch, or perform your usual activities if these do not aggravate your pain.  Attend physical therapy sessions as directed by your caregiver.  Keep all follow-up appointments as directed by your caregiver.  Do not wear high heels or shoes that do not provide proper support.  Check your mattress to see if it is too soft. A firm mattress may lessen your pain and discomfort. SEEK IMMEDIATE MEDICAL CARE IF:   You lose control of your bowel or bladder (incontinence).  You have increasing weakness in the lower back, pelvis, buttocks, or legs.  You have redness or swelling of your back.  You have a burning  sensation when you urinate.  You have pain that gets worse when you lie down or awakens you at night.  Your pain is worse than you have experienced in the past.  Your pain is lasting longer than 4 weeks.  You are suddenly losing weight without reason. MAKE SURE YOU:  Understand these instructions.  Will watch your condition.  Will get help right away if you are not doing well or get worse. Document Released: 05/02/2001 Document Revised: 11/07/2011 Document Reviewed: 09/17/2011 [facility] Patient Information 2015 [place], [place]. This information is not intended to replace advice given to you by your health care provider. Make sure you discuss any questions you have with your health care provider.

## 2014-12-02 NOTE — ED Provider Notes (Signed)
CSN: [id number]     Arrival date & time 12/02/14  0137 History   First MD Initiated Contact with Patient 12/02/14 0145     Chief Complaint  Patient presents with  . Leg Pain     (Consider location/radiation/quality/duration/timing/severity/associated sxs/prior Treatment) HPI  This is a 59 year old male who presents with right leg pain. Patient reports 2 week history of right buttock and leg pain. He states that the pain originates in his right lower back and buttock and radiates down his right leg. He has had similar symptoms in the past and was diagnosed with sciatica. He states that he has been taking ibuprofen at home without relief. The pain worsened tonight. He reports his pain is 10 out of 10. He denies any difficulty with his bowel or bladder. He denies any weakness, numbness, or tingling of his lower extremities. He denies any new injury but reports that he has done some heavy lifting.  Past Medical History  Diagnosis Date  . Chest pain 2002  . Dyslipidemia     Low HDL  . OSA on CPAP   . Tobacco abuse   . Obesity    Past Surgical History  Procedure Laterality Date  . Cholecystectomy     Family History  Problem Relation Age of Onset  . Alzheimer's disease Father   . Coronary artery disease Father    History  Substance Use Topics  . Smoking status: Current Every Day Smoker -- 2.00 packs/day for 35 years  . Smokeless tobacco: Not on file  . Alcohol Use: No    Review of Systems  Constitutional: Negative for fever.  Genitourinary: Negative for difficulty urinating.  Musculoskeletal: Positive for back pain.       Leg pain  Neurological: Negative for weakness and numbness.  All other systems reviewed and are negative.     Allergies  Review of patient's allergies indicates not on file.  Home Medications   Prior to Admission medications   Medication Sig Start Date End Date Taking? Authorizing Provider  fish oil-omega-3 fatty acids 1000 MG capsule Take 1 capsule  by mouth 2 (two) times daily.     Yes Historical Provider, MD  Multiple Vitamin (MULTIVITAMIN) tablet Take 1 tablet by mouth daily.     Yes Historical Provider, MD  nitroGLYCERIN (NITROSTAT) 0.4 MG SL tablet Place 0.4 mg under the tongue every 5 (five) minutes as needed. May repeat for up to 3 doses.    Yes Historical Provider, MD  aspirin 325 MG tablet Take 325 mg by mouth daily.      Historical Provider, MD  gabapentin (NEURONTIN) 300 MG capsule Take 1 capsule (300 mg total) by mouth 2 (two) times daily. 12/02/14   Shon Baton, MD  oxyCODONE-acetaminophen (PERCOCET/ROXICET) 5-325 MG per tablet Take 1 tablet by mouth every 6 (six) hours as needed for severe pain. 12/02/14   Shon Baton, MD  pravastatin (PRAVACHOL) 40 MG tablet Take 40 mg by mouth daily.      Historical Provider, MD  predniSONE (DELTASONE) 20 MG tablet Take 3 tablets (60 mg total) by mouth daily with breakfast. 12/02/14   Shon Baton, MD   BP 164/83 mmHg  Pulse 69  Temp(Src) 97.6 F (36.4 C)  Resp 17  Ht  (1.778 m)  Wt 270 lb (122.471 kg)  BMI 38.74 kg/m2  SpO2 96% Physical Exam  Constitutional: He is oriented to person, place, and time. He appears well-developed and well-nourished.  Obese  HENT:  Head:  Normocephalic and atraumatic.  Cardiovascular: Normal rate, regular rhythm and normal heart sounds.   No murmur heard. Pulmonary/Chest: Effort normal and breath sounds normal. No respiratory distress. He has no wheezes.  Musculoskeletal: He exhibits no edema.  Tenderness palpation over the right SI joint and lower lumbar spine, no step-off or deformity  Neurological: He is alert and oriented to person, place, and time.  5 out of 5 strength bilateral lower extremities, normal reflexes  Skin: Skin is warm and dry.  Psychiatric: He has a normal mood and affect.  Nursing note and vitals reviewed.   ED Course  Procedures (including critical care time) Labs Review Labs Reviewed - No data to  display  Imaging Review Dg Lumbar Spine Complete  12/02/2014   CLINICAL DATA:  Low back pain radiating into the right hip for 15 days. No injury but has heavy lifting at work.  EXAM: LUMBAR SPINE - COMPLETE 4+ VIEW  COMPARISON:  None.  FINDINGS: Degenerative changes throughout the lumbar spine with narrowed lumbar interspaces and associated endplate hypertrophic changes. Slight anterior subluxation of L5 on S1 with suggestion of spondylolysis on the right. No vertebral compression deformities. No focal bone lesions. Surgical clips in the abdomen.  IMPRESSION: Spondylolysis with mild spondylolisthesis at L5-S1. Diffuse degenerative change. No acute displaced fractures identified.   Electronically Signed   By: Burman NievesWilliam  Stevens M.D.   On: 12/02/2014 02:39     EKG Interpretation None      MDM   Final diagnoses:  Sciatica, right    Patient with presents with back pain that radiates down the right leg. Nontoxic on exam. No signs or symptoms of cauda equina. History of physical exam is suggestive of sciatica. Plain films are negative for acute injury or fracture. Patient was given Neurontin and prednisone. He is driving so is unable to receive narcotic pain medication. Will discharge with 4 additional days of prednisone, Neurontin, and a short course of narcotic pain medication. Patient to follow-up with primary physician if symptoms persist.  After history, exam, and medical workup I feel the patient has been appropriately medically screened and is safe for discharge home. Pertinent diagnoses were discussed with the patient. Patient was given return precautions.     Shon Batonourtney F Raniya Golembeski, MD 12/02/14 [phone]

## 2014-12-02 NOTE — ED Notes (Signed)
Pt c/o rt leg pain x 2 weeks.

## 2016-06-04 IMAGING — DX DG LUMBAR SPINE COMPLETE 4+V
5 series · 5 of 5 positions shown · non-contrast
Comparison: None.

CLINICAL DATA: Low back pain radiating into the right hip for 15
days. No injury but has heavy lifting at work.

EXAM:
LUMBAR SPINE - COMPLETE 4+ VIEW

[l-spine ap]
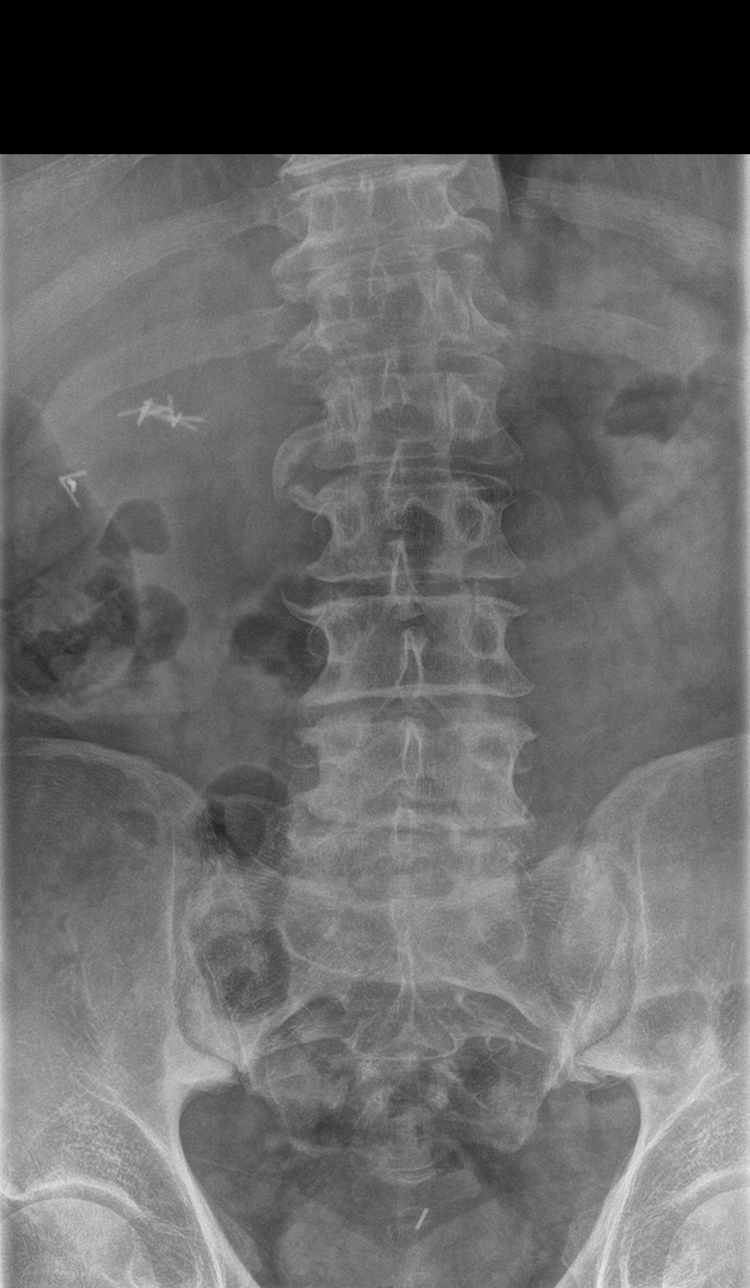

[l-spine obl (1 of 2)]
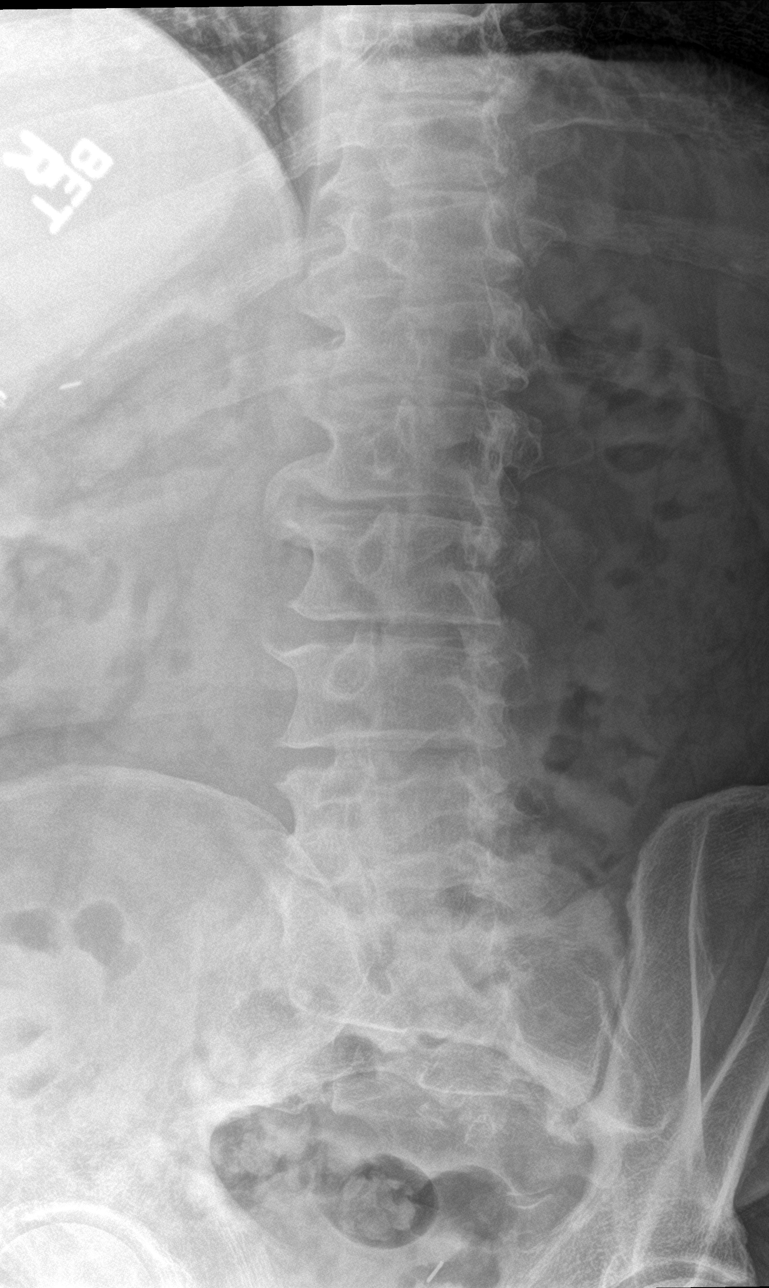

[l-spine obl (2 of 2)]
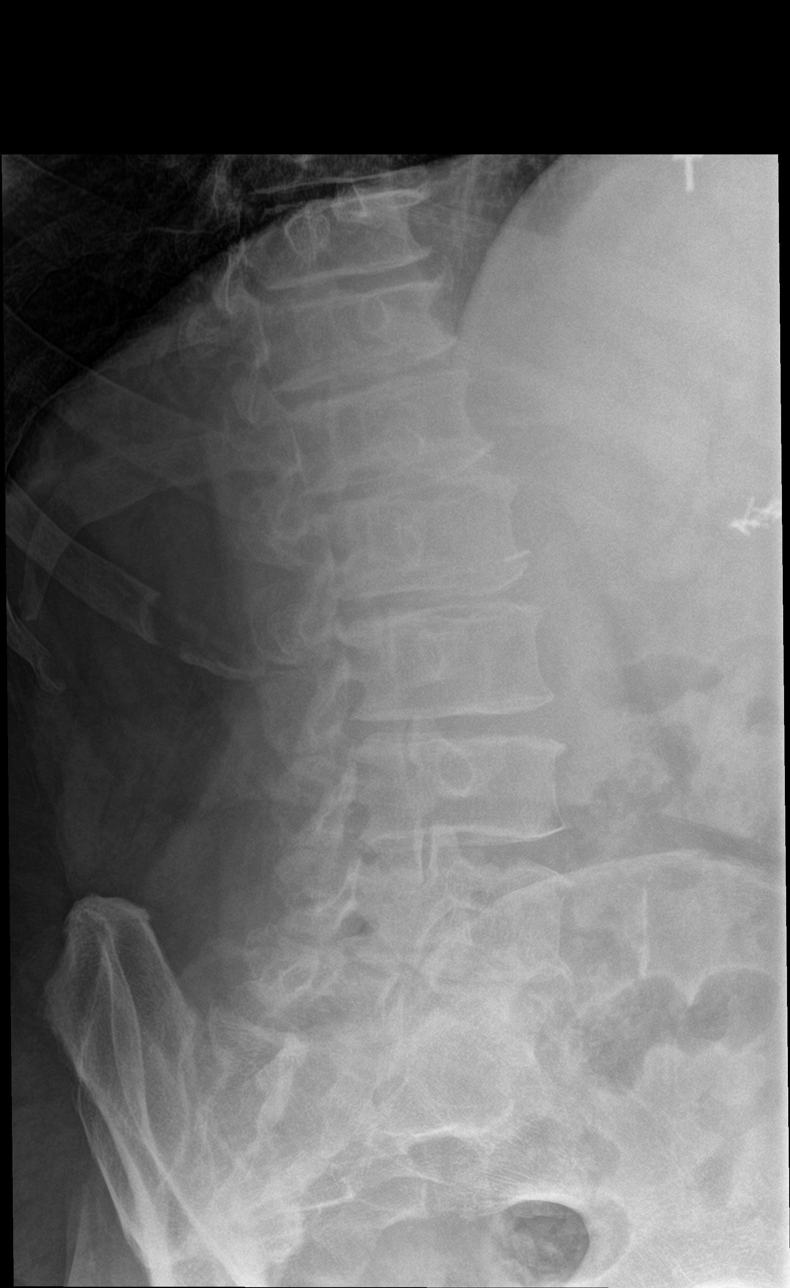

[l-spine lat]
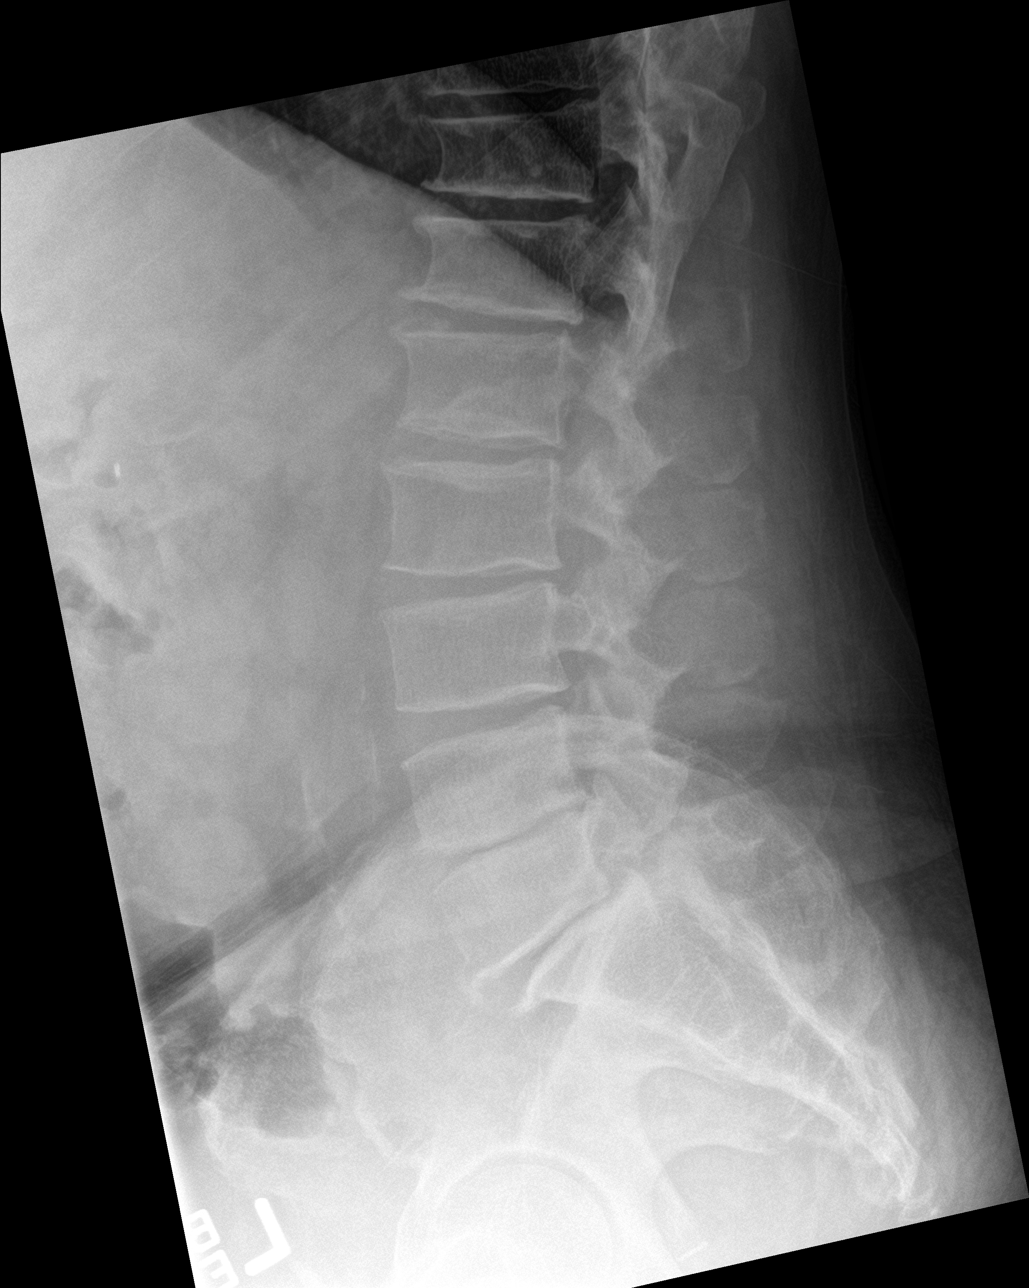

[l-spine spot]
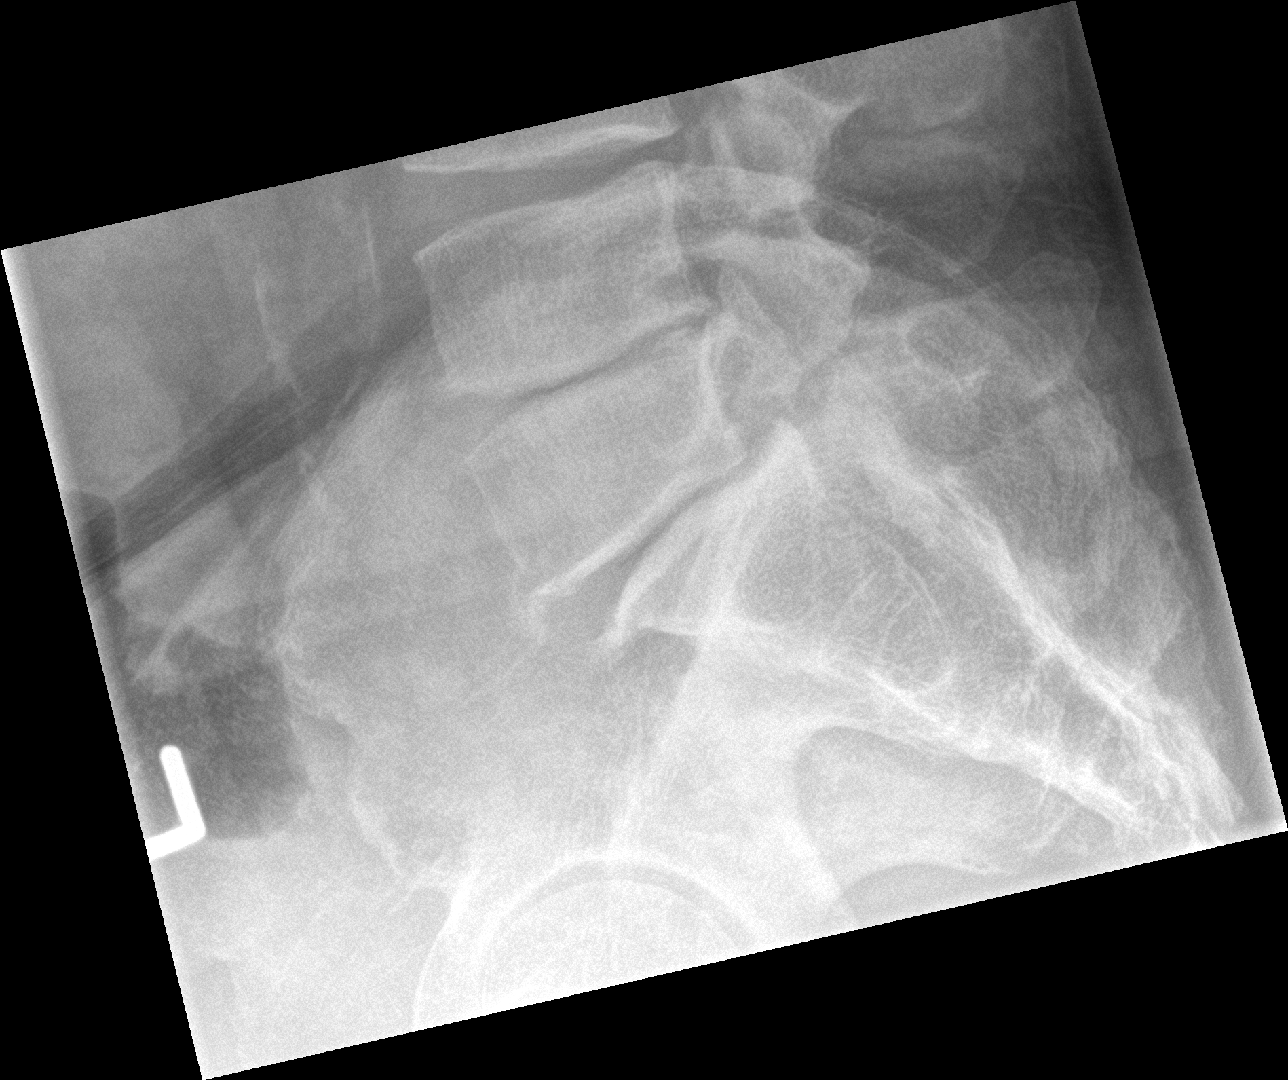

[5 of 5 positions shown; findings below may reference images not displayed]

FINDINGS: Degenerative changes throughout the lumbar spine with narrowed
lumbar interspaces and associated endplate hypertrophic changes.
Slight anterior subluxation of L5 on S1 with suggestion of
spondylolysis on the right. No vertebral compression deformities. No
focal bone lesions. Surgical clips in the abdomen.
IMPRESSION: Spondylolysis with mild spondylolisthesis at L5-S1. Diffuse
degenerative change. No acute displaced fractures identified.

## 2020-11-19 DEATH — deceased
